# Patient Record
Sex: Male | Born: 1960 | Race: Black or African American | Hispanic: No | Marital: Single | Smoking: Never smoker
Health system: Southern US, Community
[De-identification: ages and names within clinical notes are randomized; demographics above are authoritative.]

## PROBLEM LIST (undated history)

## (undated) DIAGNOSIS — R011 Cardiac murmur, unspecified: Secondary | ICD-10-CM

---

## 2013-12-04 ENCOUNTER — Emergency Department: Payer: Self-pay | Admitting: Emergency Medicine

## 2013-12-04 LAB — BASIC METABOLIC PANEL
ANION GAP: 7 (ref 7–16)
BUN: 17 mg/dL (ref 7–18)
CALCIUM: 9.3 mg/dL (ref 8.5–10.1)
CREATININE: 0.72 mg/dL (ref 0.60–1.30)
Chloride: 106 mmol/L (ref 98–107)
Co2: 25 mmol/L (ref 21–32)
EGFR (African American): >60
EGFR (Non-African Amer.): >60
GLUCOSE: 118 mg/dL — AB (ref 65–99)
Osmolality: 278 (ref 275–301)
POTASSIUM: 3.8 mmol/L (ref 3.5–5.1)
SODIUM: 138 mmol/L (ref 136–145)

## 2013-12-04 LAB — TROPONIN I: Troponin-I: <0.02 ng/mL

## 2013-12-04 LAB — CBC
HCT: 41.5 % (ref 40.0–52.0)
HGB: 13.7 g/dL (ref 13.0–18.0)
MCH: 31.2 pg (ref 26.0–34.0)
MCHC: 33.1 g/dL (ref 32.0–36.0)
MCV: 94 fL (ref 80–100)
Platelet: 219 10*3/uL (ref 150–440)
RBC: 4.4 10*6/uL (ref 4.40–5.90)
RDW: 13.2 % (ref 11.5–14.5)
WBC: 6.8 10*3/uL (ref 3.8–10.6)

## 2013-12-05 ENCOUNTER — Emergency Department: Payer: Self-pay | Admitting: Emergency Medicine

## 2013-12-05 LAB — CBC
HCT: 43.1 %
HGB: 14.2 g/dL
MCH: 31 pg
MCHC: 33 g/dL
MCV: 94 fL
Platelet: 199 x10 3/mm 3
RBC: 4.58 x10 6/mm 3
RDW: 13.6 %
WBC: 5.7 x10 3/mm 3

## 2013-12-05 LAB — COMPREHENSIVE METABOLIC PANEL WITH GFR
Albumin: 4 g/dL
Alkaline Phosphatase: 76 U/L
Anion Gap: 6 — ABNORMAL LOW
BUN: 12 mg/dL
Bilirubin,Total: 0.5 mg/dL
Calcium, Total: 9.4 mg/dL
Chloride: 105 mmol/L
Co2: 29 mmol/L
Creatinine: 0.67 mg/dL
EGFR (African American): >60
EGFR (Non-African Amer.): >60
Glucose: 109 mg/dL — ABNORMAL HIGH
Osmolality: 280
Potassium: 4.1 mmol/L
SGOT(AST): 23 U/L
SGPT (ALT): 18 U/L
Sodium: 140 mmol/L
Total Protein: 7.5 g/dL

## 2013-12-05 LAB — DRUG SCREEN, URINE
Amphetamines, Ur Screen: NEGATIVE (ref ?–1000)
BARBITURATES, UR SCREEN: NEGATIVE (ref ?–200)
BENZODIAZEPINE, UR SCRN: NEGATIVE (ref ?–200)
Cannabinoid 50 Ng, Ur ~~LOC~~: NEGATIVE (ref ?–50)
Cocaine Metabolite,Ur ~~LOC~~: NEGATIVE (ref ?–300)
MDMA (ECSTASY) UR SCREEN: NEGATIVE (ref ?–500)
Methadone, Ur Screen: NEGATIVE (ref ?–300)
Opiate, Ur Screen: NEGATIVE (ref ?–300)
Phencyclidine (PCP) Ur S: NEGATIVE (ref ?–25)
Tricyclic, Ur Screen: NEGATIVE (ref ?–1000)

## 2013-12-05 LAB — SALICYLATE LEVEL

## 2013-12-05 LAB — ETHANOL
Ethanol %: <0.003 % (ref 0.000–0.080)
Ethanol: <3 mg/dL

## 2013-12-05 LAB — TROPONIN I: Troponin-I: <0.02 ng/mL

## 2013-12-05 LAB — TSH: Thyroid Stimulating Horm: 0.49 u[IU]/mL

## 2013-12-05 LAB — ACETAMINOPHEN LEVEL: Acetaminophen: <2 ug/mL

## 2013-12-22 ENCOUNTER — Emergency Department: Payer: Self-pay | Admitting: Emergency Medicine

## 2014-02-08 ENCOUNTER — Emergency Department (HOSPITAL_COMMUNITY)
Admission: EM | Admit: 2014-02-08 | Discharge: 2014-02-08 | Disposition: A | Discharge status: Home / Self Care | Payer: Self-pay | Attending: Emergency Medicine | Admitting: Emergency Medicine

## 2014-02-08 ENCOUNTER — Emergency Department (HOSPITAL_COMMUNITY): Payer: Self-pay

## 2014-02-08 ENCOUNTER — Encounter (HOSPITAL_COMMUNITY): Payer: Self-pay | Admitting: Emergency Medicine

## 2014-02-08 DIAGNOSIS — R5381 Other malaise: Secondary | ICD-10-CM | POA: Insufficient documentation

## 2014-02-08 DIAGNOSIS — R011 Cardiac murmur, unspecified: Secondary | ICD-10-CM | POA: Insufficient documentation

## 2014-02-08 DIAGNOSIS — M546 Pain in thoracic spine: Secondary | ICD-10-CM | POA: Insufficient documentation

## 2014-02-08 DIAGNOSIS — R5383 Other fatigue: Secondary | ICD-10-CM

## 2014-02-08 DIAGNOSIS — R42 Dizziness and giddiness: Secondary | ICD-10-CM | POA: Insufficient documentation

## 2014-02-08 DIAGNOSIS — R109 Unspecified abdominal pain: Secondary | ICD-10-CM | POA: Insufficient documentation

## 2014-02-08 DIAGNOSIS — R609 Edema, unspecified: Secondary | ICD-10-CM | POA: Insufficient documentation

## 2014-02-08 DIAGNOSIS — H9190 Unspecified hearing loss, unspecified ear: Secondary | ICD-10-CM | POA: Insufficient documentation

## 2014-02-08 DIAGNOSIS — R0789 Other chest pain: Secondary | ICD-10-CM | POA: Insufficient documentation

## 2014-02-08 DIAGNOSIS — R110 Nausea: Secondary | ICD-10-CM | POA: Insufficient documentation

## 2014-02-08 DIAGNOSIS — R197 Diarrhea, unspecified: Secondary | ICD-10-CM | POA: Insufficient documentation

## 2014-02-08 HISTORY — DX: Cardiac murmur, unspecified: R01.1

## 2014-02-08 LAB — BASIC METABOLIC PANEL
ANION GAP: 13 (ref 5–15)
BUN: 19 mg/dL (ref 6–23)
CALCIUM: 9.4 mg/dL (ref 8.4–10.5)
CO2: 24 mEq/L (ref 19–32)
Chloride: 102 mEq/L (ref 96–112)
Creatinine, Ser: 0.93 mg/dL (ref 0.50–1.35)
GFR calc Af Amer: >90 mL/min (ref >90)
GFR calc non Af Amer: >90 mL/min (ref >90)
GLUCOSE: 93 mg/dL (ref 70–99)
Potassium: 4.2 mEq/L (ref 3.7–5.3)
SODIUM: 139 meq/L (ref 137–147)

## 2014-02-08 LAB — I-STAT TROPONIN, ED
TROPONIN I, POC: 0 ng/mL (ref 0.00–0.08)
Troponin i, poc: 0 ng/mL (ref 0.00–0.08)
Troponin i, poc: 0 ng/mL (ref 0.00–0.08)

## 2014-02-08 LAB — CBC
HCT: 41.9 % (ref 39.0–52.0)
Hemoglobin: 14 g/dL (ref 13.0–17.0)
MCH: 31 pg (ref 26.0–34.0)
MCHC: 33.4 g/dL (ref 30.0–36.0)
MCV: 92.7 fL (ref 78.0–100.0)
PLATELETS: 237 10*3/uL (ref 150–400)
RBC: 4.52 MIL/uL (ref 4.22–5.81)
RDW: 13.5 % (ref 11.5–15.5)
WBC: 5.7 10*3/uL (ref 4.0–10.5)

## 2014-02-08 LAB — URINALYSIS, ROUTINE W REFLEX MICROSCOPIC
BILIRUBIN URINE: NEGATIVE
Glucose, UA: NEGATIVE mg/dL
Hgb urine dipstick: NEGATIVE
Ketones, ur: NEGATIVE mg/dL
Leukocytes, UA: NEGATIVE
NITRITE: NEGATIVE
Protein, ur: NEGATIVE mg/dL
SPECIFIC GRAVITY, URINE: 1.01 (ref 1.005–1.030)
Urobilinogen, UA: 0.2 mg/dL (ref 0.0–1.0)
pH: 5 (ref 5.0–8.0)

## 2014-02-08 LAB — RAPID URINE DRUG SCREEN, HOSP PERFORMED
Amphetamines: NOT DETECTED
BARBITURATES: NOT DETECTED
Benzodiazepines: NOT DETECTED
COCAINE: NOT DETECTED
Opiates: POSITIVE — AB
TETRAHYDROCANNABINOL: NOT DETECTED

## 2014-02-08 LAB — D-DIMER, QUANTITATIVE: D-Dimer, Quant: <0.27 ug/mL-FEU (ref 0.00–0.48)

## 2014-02-08 MED ORDER — MORPHINE SULFATE 4 MG/ML IJ SOLN
4.0000 mg | Freq: Once | INTRAMUSCULAR | Status: AC
  Administered 2014-02-08: 4 mg via INTRAVENOUS
  Filled 2014-02-08: qty 1

## 2014-02-08 MED ORDER — IOHEXOL 350 MG/ML SOLN
100.0000 mL | Freq: Once | INTRAVENOUS | Status: AC | PRN
  Administered 2014-02-08: 100 mL via INTRAVENOUS

## 2014-02-08 MED ORDER — ONDANSETRON HCL 4 MG/2ML IJ SOLN
4.0000 mg | Freq: Once | INTRAMUSCULAR | Status: AC
  Administered 2014-02-08: 4 mg via INTRAVENOUS
  Filled 2014-02-08: qty 2

## 2014-02-08 MED ORDER — TRAMADOL HCL 50 MG PO TABS: 50.0000 mg | ORAL_TABLET | Freq: Four times a day (QID) | ORAL | Status: DC | PRN

## 2014-02-08 MED ORDER — SODIUM CHLORIDE 0.9 % IV BOLUS (SEPSIS)
500.0000 mL | Freq: Once | INTRAVENOUS | Status: AC
  Administered 2014-02-08: 500 mL via INTRAVENOUS

## 2014-02-08 MED ORDER — TRAMADOL HCL 50 MG PO TABS: 50.0000 mg | ORAL_TABLET | Freq: Four times a day (QID) | ORAL | Status: AC | PRN

## 2014-02-08 NOTE — ED Notes (Signed)
Pt presents with onset of mid-sternal chest pain and shortness of breath just PTA while walking.  Pt reports pain radiates to mid-scapular area, into both clavicles, into abdomen and both legs.  Pt reports nausea.

## 2014-02-08 NOTE — ED Provider Notes (Signed)
3:34 PM BP 109/75  Pulse 54  Temp(Src) 97.4 F (36.3 C) (Oral)  Resp 16  SpO2 100% Assumed care of the patient form PA Laveda Normanran.  Patient is a poor historian. C/O acute onset cp radiating to BL shoulders, abdomen.  All labs/ ekg negative to this point including. HEART score is 1. Negative troponin. D-Dimer negative.  5:34 PM BP 109/75  Pulse 54  Temp(Src) 97.4 F (36.3 C) (Oral)  Resp 16  SpO2 100% Patient with negative CTA chest/abd/pelvis Patient will need delta troponin Which I have ordered.   6:49 PM Patient is an extremely poor historian. He continues to complain of severe chest pain. I question if the patient has psychosis or moderate cognitive delay. He does state that he was hospitalized in 2011 for the same problem [facility] [place] We have called to obtain records. 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 He states his pain is severe every time he talks or move.   8:07 PM I was able to [facility] [place]. He had 3 visits there all for minor pain complaints.1 for dental pain and  For chronic shoulder pain and chest pain. No ICU admission.  9:15 PM 3 negative troponins here in the ED.  Doubt Highly  ACS as the cause of the patient's pain. I have discussed the case with Dr. Devoria AlbeIva Knapp who agrees with the plan of care.  Will discharge with tramadol and resource guide. [place].     Arthor CaptainAbigail Kindall Swaby, PA-C 02/09/14 [phone]

## 2014-02-08 NOTE — ED Provider Notes (Signed)
CSN: [id number]     Arrival date & time 02/08/14  1204 History   First MD Initiated Contact with Patient 02/08/14 1354     No chief complaint on file.    (Consider location/radiation/quality/duration/timing/severity/associated sxs/prior Treatment) HPI  53 year old male who has a past history of heart murmur presenting to the ED for evaluation of a substernal chest pain and shortness of breath. History is difficult to obtain as pt does not give good history.  Acute onset of CP 45 min ago, progressive worse, sharp, non radiating.  Endorse SOB, lightheadedness, dizzy, nausea, abd pain, fatigue, diarrhea, and leg edema along with hearing changes. Sts pain now radiates to mid-scapular, into both clavicles down in abdomen then now down to both legs.  Denies any urinary or bowel changes. No prior history of heart disease. Patient is nonsmoker. No prior history of PE or DVT, no recent surgery, prolonged bed rest leg swelling, history of cancer, or hemoptysis. sts the last time he has pain like this was when he was in the ICU in 2013 but unable to specify for what reason and what kind of treatment he received. No specific treatment tried. Pt sts he is here visiting a friend.  Denies alcohol or rec drug use, not a smoker, denies hx of AAA.  No SI/HI/hallucination.    Past Medical History  Diagnosis Date  . Heart murmur    History reviewed. No pertinent past surgical history. History reviewed. No pertinent family history. History  Substance Use Topics  . Smoking status: Never Smoker   . Smokeless tobacco: Not on file  . Alcohol Use: No    Review of Systems  All other systems reviewed and are negative.     Allergies  Review of patient's allergies indicates no known allergies.  Home Medications   Prior to Admission medications   Not on File   BP 121/72  Pulse 75  Temp(Src) 98.4 F (36.9 C) (Oral)  Resp 16  SpO2 97% Physical Exam  Constitutional: He is oriented to person, place, and  time. He appears well-developed and well-nourished. No distress.  HENT:  Head: Atraumatic.  Eyes: Conjunctivae are normal.  Neck: Normal range of motion. Neck supple. No JVD present. No tracheal deviation present.  Cardiovascular: Normal rate, regular rhythm and intact distal pulses.  Exam reveals no gallop and no friction rub.   No murmur heard. Intact pulses to bilateral upper extremities and lower extremities  Pulmonary/Chest: Effort normal and breath sounds normal. No respiratory distress. He has no wheezes. He has no rales. He exhibits no tenderness.  Abdominal: Soft. He exhibits no distension. There is no tenderness. There is no rebound and no guarding.  Abdomen is soft, nontender, no palpable pulsatile mass, and no bruit noted  Genitourinary:  No CVA tenderness  Musculoskeletal: He exhibits no edema and no tenderness.  No midline spine tenderness crepitus step-off  Lymphadenopathy:    He has no cervical adenopathy.  Neurological: He is alert and oriented to person, place, and time.  Skin: No rash noted.  Psychiatric: He has a normal mood and affect.    ED Course  Procedures (including critical care time)  3:10 PM Patient who is an extremely poor historian presents complaining of midsternal chest pain with shortness of breath that is acute in onset radiation is back at his abdomen and down to his legs. Pain is gone reproducible on exam. Patient states his pain is "internal". He is hemodynamically stable. Given his poor history and his primary complaint,  cannot rule out PE or dissection. i have discussed with my attending who felt pt will benefit from advance imaging to r/o acute emergent condition.  Will obtain chest/abd/pelvis CT angio to r/o dissection.    3:35 PM Care discussed with oncoming provider who will continue to monitor pt and continue with plan of care.  Pt will need delta trop and reassessment of pain.  Will also need to ambulate with O2 monitor    Labs Review Labs  Reviewed  CBC  BASIC METABOLIC PANEL  Rosezena SensorI-STAT TROPOININ, ED    Imaging Review Dg Chest Port 1 View  02/08/2014   CLINICAL DATA:  Chest pain.  EXAM: PORTABLE CHEST - 1 VIEW  COMPARISON:  12/04/2013  FINDINGS: 1348 hrs. Lung volumes are low. No edema or focal airspace consolidation. No pleural effusion. Cardiopericardial silhouette is at upper limits of normal for size. Imaged bony structures of the thorax are intact. Telemetry leads overlie the chest.  IMPRESSION: Low volume film without acute findings.   Electronically Signed   By: Kennith CenterEric  Mansell M.D.   On: 02/08/2014 13:54     EKG Interpretation   Date/Time:  Thursday February 08 2014 12:18:22 EDT Ventricular Rate:  75 PR Interval:  174 QRS Duration: 76 QT Interval:  390 QTC Calculation: 435 R Axis:   57 Text Interpretation:  Normal sinus rhythm Normal ECG No old tracing to  compare Confirmed by [facility]  MD, Nicholos JohnsKATHLEEN [phone]([id number]) on 02/08/2014 1:58:59 PM      MDM   Final diagnoses:  None    BP 109/75  Pulse 54  Temp(Src) 97.4 F (36.3 C) (Oral)  Resp 16  SpO2 100%     Fayrene HelperBowie Charmel Pronovost, PA-C 02/08/14 1537

## 2014-02-08 NOTE — ED Notes (Signed)
Patient transported to CT 

## 2014-02-08 NOTE — Discharge Instructions (Signed)
Your caregiver has diagnosed you as having chest pain that is not specific for one problem, but does not require admission.  You are at low risk for an acute heart condition or other serious illness. Chest pain comes from many different causes.  SEEK IMMEDIATE MEDICAL ATTENTION IF: You have severe chest pain, especially if the pain is crushing or pressure-like and spreads to the arms, back, neck, or jaw, or if you have sweating, nausea (feeling sick to your stomach), or shortness of breath. THIS IS AN EMERGENCY. Don't wait to see if the pain will go away. Get medical help at once. Call 911 or 0 (operator). DO NOT drive yourself to the hospital.  Your chest pain gets worse and does not go away with rest.  You have an attack of chest pain lasting longer than usual, despite rest and treatment with the medications your caregiver has prescribed.  You wake from sleep with chest pain or shortness of breath.  You feel dizzy or faint.  You have chest pain not typical of your usual pain for which you originally saw your caregiver.  Back Pain, Adult Low back pain is very common. About 1 in 5 people have back pain.The cause of low back pain is rarely dangerous. The pain often gets better over time.About half of people with a sudden onset of back pain feel better in just 2 weeks. About 8 in 10 people feel better by 6 weeks.  CAUSES Some common causes of back pain include:  Strain of the muscles or ligaments supporting the spine.  Wear and tear (degeneration) of the spinal discs.  Arthritis.  Direct injury to the back. DIAGNOSIS Most of the time, the direct cause of low back pain is not known.However, back pain can be treated effectively even when the exact cause of the pain is unknown.Answering your caregiver's questions about your overall health and symptoms is one of the most accurate ways to make sure the cause of your pain is not dangerous. If your caregiver needs more information, he or she may  order lab work or imaging tests (X-rays or MRIs).However, even if imaging tests show changes in your back, this usually does not require surgery. HOME CARE INSTRUCTIONS For many people, back pain returns.Since low back pain is rarely dangerous, it is often a condition that people can learn to [facility] their own.   Remain active. It is stressful on the back to sit or stand in one place. Do not sit, drive, or stand in one place for more than 30 minutes at a time. Take short walks on level surfaces as soon as pain allows.Try to increase the length of time you walk each day.  Do not stay in bed.Resting more than 1 or 2 days can delay your recovery.  Do not avoid exercise or work.Your body is made to move.It is not dangerous to be active, even though your back may hurt.Your back will likely heal faster if you return to being active before your pain is gone.  Pay attention to your body when you bend and lift. Many people have less discomfortwhen lifting if they bend their knees, keep the load close to their bodies,and avoid twisting. Often, the most comfortable positions are those that put less stress on your recovering back.  Find a comfortable position to sleep. Use a firm mattress and lie on your side with your knees slightly bent. If you lie on your back, put a pillow under your knees.  Only take over-the-counter or  prescription medicines as directed by your caregiver. Over-the-counter medicines to reduce pain and inflammation are often the most helpful.Your caregiver may prescribe muscle relaxant drugs.These medicines help dull your pain so you can more quickly return to your normal activities and healthy exercise.  Put ice on the injured area.  Put ice in a plastic bag.  Place a towel between your skin and the bag.  Leave the ice on for 15-20 minutes, 03-04 times a day for the first 2 to 3 days. After that, ice and heat may be alternated to reduce pain and spasms.  Ask your  caregiver about trying back exercises and gentle massage. This may be of some benefit.  Avoid feeling anxious or stressed.Stress increases muscle tension and can worsen back pain.It is important to recognize when you are anxious or stressed and learn ways to manage it.Exercise is a great option. SEEK MEDICAL CARE IF:  You have pain that is not relieved with rest or medicine.  You have pain that does not improve in 1 week.  You have new symptoms.  You are generally not feeling well. SEEK IMMEDIATE MEDICAL CARE IF:   You have pain that radiates from your back into your legs.  You develop new bowel or bladder control problems.  You have unusual weakness or numbness in your arms or legs.  You develop nausea or vomiting.  You develop abdominal pain.  You feel faint. Document Released: 07/20/2005 Document Revised: 01/19/2012 Document Reviewed: 12/08/2010 [facility] Patient Information 2015 [place], [place]. This information is not intended to replace advice given to you by your health care provider. Make sure you discuss any questions you have with your health care provider.  Emergency Department Resource Guide 1) Find a Doctor and Pay Out of Pocket Although you won't have to find out who is covered by your insurance plan, it is a good idea to ask around and get recommendations. You will then need to call the office and see if the doctor you have chosen will accept you as a new patient and what types of options they offer for patients who are self-pay. Some doctors offer discounts or will set up payment plans for their patients who do not have insurance, but you will need to ask so you aren't surprised when you get to your appointment.  2) Contact Your Local Health Department Not all health departments have doctors that can see patients for sick visits, but many do, so it is worth a call to see if yours does. If you don't know where your local health department is, you can check in your  phone book. The CDC also has a tool to help you locate your state's health department, and many state websites also have listings of all of their local health departments.  3) Find a Walk-in Clinic If your illness is not likely to be very severe or complicated, you may want to try a walk in clinic. These are popping up all over the country in pharmacies, drugstores, and shopping centers. They're usually staffed by nurse practitioners or physician assistants that have been trained to treat common illnesses and complaints. They're usually fairly quick and inexpensive. However, if you have serious medical issues or chronic medical problems, these are probably not your best option.  No Primary Care Doctor: - Call Health Connect at  [phone] - they can help you locate a primary care doctor that  accepts your insurance, provides certain services, etc. - Physician Referral Service- [phone]  Chronic Pain Problems:  Organization         Address  Phone   Notes  [facility]  [phone] Patients need to be referred by their primary care doctor.   Medication Assistance: Organization         Address  Phone   Notes  [facility] Medication [facility] [address] [place], [place] [address] [phone] --Must be a resident of [facility] -- Must have NO insurance coverage whatsoever (no Medicaid/ Medicare, etc.) -- The pt. MUST have a primary care doctor that directs their care regularly and follows them in the community   [facility]ist  [phone]   [employer]  [phone]    Agencies that provide inexpensive medical care: Organization         Address  Phone   Notes  [facility]  [phone]   [facility]    [phone]   [facility] [address] [place], [place] [address] [phone]   [facility] [address] [place] [place] [phone]   Planned  Parenthood    [phone]   [facility]    [phone]   [facility]  [address], [place] Phone:  [phone], Fax:  [phone] Hours of Operation:  9 am - 6 pm, M-F.  Also accepts Medicaid/Medicare and self-pay.  [facility]  [address], [place] Phone: [phone], Fax: [phone]. Hours of Operation:  8:30 am - 5:30 pm, M-F.  Also accepts Medicaid and self-pay.  [facility] [place] [address], [place] Phone: [phone]   [facility] [address] Natasha Bence [place], [place] [phone], Ext. [phone] Mondays & Thursdays: 7-9 AM.  First 15 patients are seen on a first come, first serve basis.    Medicaid-accepting [facility] Providers:  Organization         Address  Phone   Notes  [facility] [address], [place] [phone] Also accepts self-pay patients.  [facility] [address] Laurell Josephs [place], [place]  [phone]   [facility] [address], [place] [phone]   [facility] [address] [place] [phone]   Renaye Rakers [address], [place]   [phone] Only accepts [place] patients after they have their name applied to their card.   Self-Pay (no insurance) in [facility]:  Organization         Address  Phone   Notes  Sickle Cell Patients, [facility] [address] [place], [place] [phone]   [facility] [address] [place], [place] [phone]   [facility] [place]  [address], [place] [phone]   [facility]/Dr. Osei-Bonsu  [address], [place] or [address], [place] [phone] Phone number for both [place] and [place] locations is the same.    [facility] [address], [place] [phone]   [facility] [address], [place] or [address] [phone]   [facility] [address] [place] [phone], phone; [phone], fax Sees patients 1st  and 3rd Saturday of every month.  Must not qualify for public or private insurance (i.e. Medicaid, Medicare, [place] Health Choice, Veterans' Benefits)  Household income should be no more than 200% of the poverty level The clinic cannot treat you if you are pregnant or think you are pregnant  Sexually transmitted diseases are not treated at the clinic.    Dental Care: Organization         Address  Phone  Notes  [facility] Department of [facility] [address] [place], [place] [phone] Accepts children up to age 81 who are enrolled in [place] or [place] Health Choice; pregnant women with a Medicaid card; and children who have applied for Medicaid or [place] Health Choice, but were declined, whose parents can pay a reduced fee at time of service.  [facility] Department of [facility]  [address], [place] [phone] Accepts children up to age 12 who are enrolled in [place] or [place] Health Choice; pregnant women with a Medicaid card; and children who have applied for Medicaid or [place] Health Choice, but were declined, whose parents can pay a reduced fee at time of service.  [facility]  [address] [place], [place] [phone] Patients are seen by appointment only. Walk-ins are not accepted. [facility] will see patients 61 years of age and older. Monday - Tuesday (8am-5pm) Most Wednesdays (8:30-5pm) $30 per visit, cash only  [facility] Adult Dental Access PRO[facility]  [address], [facility] [phone] Patients are seen by appointment only. Walk-ins are not accepted. [facility] will see patients 9  years of age and older. One Wednesday Evening (Monthly: Volunteer Based).  $30 per visit, cash only  [employer]  [phone] for adults; Children under age 75, call Graduate Pediatric Dentistry at [phone]. Children aged 30-14, please call [phone] to request a pediatric application.  Dental services are provided in all areas of dental care including fillings, crowns and bridges, complete and partial dentures, implants, gum treatment, root canals, and extractions. Preventive care is also provided. Treatment is provided to both adults and children. Patients are selected via a lottery and there is often a waiting list.   [facility] [address] [place]  [phone] [email or web address]   [facility] [address] [place], [place] [phone], Ext. [phone] Second and Fourth Thursday of each month, opens at 6:30 AM; Clinic ends at 9 AM.  Patients are seen on a first-come first-served basis, and a limited number are seen during each clinic.   [facility]  [address] Ether Griffins [place], [place] [phone]   Eligibility Requirements You must have lived in [place], [place], or [place] counties for at least the last three months.   You cannot be eligible for state or federal sponsored [place], including [employer], [place], or [employer].   You generally cannot be eligible for healthcare insurance through your employer.    How to apply: Eligibility screenings are held every Tuesday and Wednesday afternoon from 1:00 pm until 4:00 pm. You do not need an appointment for the interview!  [facility] [address] [place], [place] [phone]   [facility] Health Department  [phone]   [facility] Health Department  [phone]   [facility] Health [facility]  [phone]    Behavioral Health Resources in the Community: Intensive Outpatient  Programs Organization  Address  Phone  Notes  [facility] [address], [place], [place] [phone]   [facility] [address], [place], [place] [phone]   [facility] [address], [place], [place]  [phone]   [facility] [address],  [place], [place] [phone] or [phone]   Substance Abuse Resources Organization         Address  Phone  Notes  [facility]  [phone]   [facility]  [phone]   [facility]  [phone]   [facility]  [phone]   Residential & Outpatient Substance Abuse Program  [phone]   Psychological Services Organization         Address  Phone  Notes  [facility]  [phone]- [phone]   [facility]  [phone]- [phone]   [facility] [address], [place] [phone] or [phone]    Mobile Crisis Teams Organization         Address  Phone  Notes  [facility], Mobile Crisis Care Unit  [phone]   [facility]  [address] [place], [place] [phone]   Doristine LocksSharon DeEsch [address] [place] [phone]    Self-Help/Support Groups Organization         Address  Phone             Notes  [facility] [place] - variety of support groups  [phone]- [phone] Call for more information  Narcotics Anonymous (NA), [facility] [address], [place]  2 meetings at this location   Statisticianesidential Treatment Programs Organization         Address  Phone  Notes  [facility] [address],    [place]  [phone]   [facility]  [address], [place] [phone], [place], [place] [phone]   [facility] [address], [place] [phone] Admissions: 8am-3pm M-F  [facility] [address]    [place], [place]  [phone]   [facility] [address], [place], [place] [phone]   [facility] [address]  [place], [place] [phone]   [facility] - Intensive Outpatient [address], [place], [place] [phone]   [facility] ([facility] [address]  [place], [place] [phone] or [phone]   [facility] ([facility]) [address] [place], [place] [phone] Accepts Medicaid  [facility] [address]  [place] [phone] Substance Abuse/Addiction Treatment   [facility] Resources Organization         Address  Phone  Notes  [facility]  [phone]   Angie FavaJulie Brannon, PhD [address] [place], [place]   [phone] or [phone]   [facility]   [address] [place], [place] [phone]   [facility] [address], [place], [place] [phone] Insurance/Medicaid/sponsorship through [facility]  [facility] [address]                                    [place], [place] [phone] Therapy/tele-psych/case  [facility] [address]   [place], [place] [phone]) [phone]    Dr. Lolly MustacheArfeen  [phone]   [facility]  Felt Dept. 1) [address] [place] 2) [place] 3)  [address], [place] [phone]  [phone]   [place] [phone] or [phone] (After Hours)

## 2014-02-09 NOTE — ED Provider Notes (Signed)
Medical screening examination/treatment/procedure(s) were performed by non-physician practitioner and as supervising physician I was immediately available for consultation/collaboration.   EKG Interpretation   Date/Time:  Thursday February 08 2014 12:18:22 EDT Ventricular Rate:  75 PR Interval:  174 QRS Duration: 76 QT Interval:  390 QTC Calculation: 435 R Axis:   57 Text Interpretation:  Normal sinus rhythm Normal ECG No old tracing to  compare Confirmed by [facility]  MD, Nicholos JohnsKATHLEEN [phone]([id number]) on 02/08/2014 1:58:59 PM      Devoria AlbeIva Caleen Taaffe, MD, Franz DellFACEP   Sharonica Kraszewski L Briellah Baik, MD 02/09/14 1413

## 2014-02-10 NOTE — ED Provider Notes (Signed)
Medical screening examination/treatment/procedure(s) were performed by non-physician practitioner and as supervising physician I was immediately available for consultation/collaboration.   EKG Interpretation   Date/Time:  Thursday February 08 2014 12:18:22 EDT Ventricular Rate:  75 PR Interval:  174 QRS Duration: 76 QT Interval:  390 QTC Calculation: 435 R Axis:   57 Text Interpretation:  Normal sinus rhythm Normal ECG No old tracing to  compare Confirmed by [facility]  MD, Nicholos JohnsKATHLEEN [phone]([id number]) on 02/08/2014 1:58:59 PM        Laray AngerKathleen M Venera Privott, DO 02/10/14 1358

## 2014-11-24 NOTE — Consult Note (Signed)
PATIENT NAME:  Larry Brennan, Larry Brennan MR#:  [id number] DATE OF BIRTH:  [birth year]  DATE OF CONSULTATION:  12/06/2013  REFERRING PHYSICIAN:   CONSULTING PHYSICIAN:  Audery Amel, MD  IDENTIFYING INFORMATION AND REASON FOR CONSULT:  A 54 year old gentleman, who came to the Emergency Room initially complaining of physical problems, but then was noted to be mentally ill. Consultation for appropriate disposition.   HISTORY OF PRESENT ILLNESS:  Information obtained from the chart and the patient. The patient apparently came to the Emergency Room complaining of chest pain. No acute serious pathology was identified regarding chest pain, but it became obvious to the Emergency Room doctor that the patient was disorganized and psychotic in his thinking. Commitment paperwork was filed, and he was referred to psychiatry. The patient is not a good historian. He denies to me that he has a mental illness and denies that he has ever seen anyone for any kind of mental health treatment in the past and denies that he has ever been in a hospital. He rambles, but also tends to repeat himself saying that he just needs to be given his discharge paperwork, so that he can be on his way. He has told variations of his plan to multiple people here in the Emergency Room. He told one person he planned to go back to [place], another that he planned to go to [place] and various other places. As near as we can tell, this gentleman probably has schizophrenia and probably tends to ramble on [employer] buses where he can. It is very difficult to engage him in conversation, so I cannot really confirm the details of that. He is currently not reporting any suicidal or homicidal ideation or any acute dangerousness.   PAST PSYCHIATRIC HISTORY:  He really will not give me any information. I would be astonished if this gentleman had never had psychiatric treatment or evaluation in the past given how obviously psychotic he is, but that is what he is  saying. Denies any history of violence or suicide attempts.   SUBSTANCE ABUSE HISTORY:  He denied to me that he uses any alcohol or drugs at all. Right now, we do not seem to have anything to contradict that.   PAST MEDICAL HISTORY:  He tells me that he has a breathing chest disorder. As near as I can tell, he seems to be saying that he has asthma. I cannot get any other clear information about any medical problems from him.   SOCIAL HISTORY:  Again, very little information available. He seems to probably be homeless. It appears that he went to the homeless shelter here in town and was turned away because he is not a resident of our community, but is from out of state. This left him with no place at all to stay. He seems unconcerned about this and tells me that he plans to go to [place] where [place] live [place].   CURRENT MEDICATIONS:  None.   ALLERGIES:  No known drug allergies.   REVIEW OF SYSTEMS:  He denies anything. Full review of systems negative. Denies hallucinations. Denies suicidal ideation. Denies depression. Denies any pain or discomfort.   MENTAL STATUS EXAMINATION:  Disheveled gentleman, looks his stated age. Otherwise appears in pretty good physical health. His cooperation was entirely on his own terms. Eye contact was good. Psychomotor activity a little bit stereotyped. Speech was flat, but loud. Pressured, but sort of telegraphic. Thought content repeats itself over and over. He does not really  engage in rational discussion. Unclear if he is responding to internal stimuli. Denies suicidal or homicidal ideation. Judgment and insight appear to be impaired. Hard to gauge intelligence. He will not cooperate with any further testing.   LABORATORY RESULTS:  TSH normal. CBC normal. No alcohol. Chemistry panel unremarkable. Drug screen negative. Remarkably normal set of labs overall.   ASSESSMENT:  A 54 year old gentleman, who certainly appears to have all of the traits of a  person with schizophrenia, but who is denying it all and not participating in any kind of conversation or treatment plan. He repeats himself over and over that he just wants to be released. We do not have any evidence that he has a rational or reasonable treatment plan. It is not clear that he is actually acutely dangerous to himself, although it is right now hard to imagine how he is going to take care of himself, and he cannot give any good account of it. Given that it is nighttime now and nothing is open, I think he will continue to stay in the Emergency Room at least overnight. We will re-evaluate tomorrow. Given the lack of obvious acute dangerousness, there is a good chance we will simply be discharging him tomorrow.   DIAGNOSIS, PRINCIPAL AND PRIMARY:  AXIS I:  Schizophrenia, undifferentiated.   SECONDARY DIAGNOSES:  AXIS I:  No further.  AXIS II:  No diagnosis.  AXIS III:  No diagnosis.  AXIS IV:  Moderate from chronic homelessness it appears.  AXIS V:  Functioning at time of evaluation 55.     ____________________________ Audery AmelJohn T. Clapacs, MD jtc:ms D: 12/06/2013 22:13:53 ET T: 12/07/2013 00:38:09 ET JOB#: [id number]  cc: Audery AmelJohn T. Clapacs, MD, <Dictator> Audery AmelJOHN T CLAPACS MD ELECTRONICALLY SIGNED 12/11/2013 0:42

## 2014-11-24 NOTE — Consult Note (Signed)
PATIENT NAME:  Larry Brennan, Larry Brennan MR#:  [id number] DATE OF BIRTH:  1961/07/29  DATE OF CONSULTATION:  12/07/2013  REFERRING PHYSICIAN:   CONSULTING PHYSICIAN:  Audery AmelJohn T. Clapacs, MD  HISTORY OF PRESENT ILLNESS: This is a 54 year old gentleman who came to the Emergency Room yesterday for a strictly medical complaint, but was observed to be disorganized and psychotic in his thinking. He was placed under involuntary commitment by the Emergency Room physician. On evaluation, the patient presents to me as being disorganized and psychotic in his thinking. His story changes frequently. He does not hold a line of conversation very stably. On the other hand, he completely denies suicidal or homicidal ideation and has not shown any dangerous behavior. He clearly understands the use of money and states that he has some money in his wallet that he can use to get himself a bus ticket to go on his way. 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 He is requesting discharge. He has been offered an antipsychotic medication and treatment and has declined it.   REVIEW OF SYSTEMS: The patient is not very cooperative with history, but denies hallucinations, denies suicidal or homicidal ideation, denies now having any acute physical symptoms. The rest of the review of systems is negative.   MENTAL STATUS EXAMINATION: A slightly disheveled gentleman with a rather odd affect. Eye contact good. Psychomotor activity normal. Speech decreased in total amount and blunted but easy to understand. Affect blunted and flat. Mood stated as fine. Thoughts appeared disorganized. His sentences often do not hold together and he repeats himself sometimes changing his story or contradicting himself. He denies hallucinations. He denies suicidal or homicidal ideation. His judgment and insight appear to be impaired, especially regarding his illness, but there is evidence that he has  been able to take care of himself recently. Not cooperative with cognitive testing, but seems alert and oriented to his situation, the time and the general place.   CURRENT MEDICATIONS: None.   ALLERGIES: No known drug allergies.   ASSESSMENT: This is a 54 year old man who on superficial mental status exam certainly appears to be disorganized in his thinking in a manner that would be very consistent with schizophrenia, although without further history the diagnosis is not 100%. At this point, there is no sign that he is acutely dangerous to himself or others. He has not shown dangerous behavior or made any threats. He is declining our offers of antipsychotic treatment. He is declining further conversation about it and wants to be discharged. At this point, I think that he does not meet commitment criteria. I will take him off involuntary commitment. The case has been discussed with the Emergency Room physician. The patient will be discharged. He declines follow-up.   DIAGNOSIS, PRINCIPAL AND PRIMARY:  AXIS I: Psychosis, not otherwise specified.   SECONDARY DIAGNOSES: AXIS I: No further.  AXIS II: No diagnosis.  AXIS III: No diagnosis.  AXIS IV: Moderate from what seems like a transient lifestyle.  AXIS V: Functioning at time of discharge 55.  ____________________________ Audery AmelJohn T. Clapacs, MD jtc:sb D: 12/07/2013 12:35:56 ET T: 12/07/2013 12:47:58 ET JOB#: [id number]  cc: Audery AmelJohn T. Clapacs, MD, <Dictator> Audery AmelJOHN T CLAPACS MD ELECTRONICALLY SIGNED 12/11/2013 0:42

## 2014-11-24 NOTE — Consult Note (Signed)
Brief Consult Note: Diagnosis: schizophrenia.   Patient was seen by consultant.   Consult note dictated.   Recommend further assessment or treatment.   Comments: Psychiatry: PAtient seen and chart reviewwed. Patient has every appearance of being schizophrenic and can't hold rational conversation. No clear disposition. Re-evaluate tomorrow.  Electronic Signatures: Audery Amellapacs, Yazlin Ekblad T (MD)  (Signed 06-May-15 21:06)  Authored: Brief Consult Note   Last Updated: 06-May-15 21:06 by Audery Amellapacs, Rayelle Armor T (MD)

## 2015-10-11 IMAGING — CR DG CHEST 1V PORT
1 series · 1 of 1 positions shown · non-contrast
Comparison: 12/04/2013

CLINICAL DATA: Chest pain.

EXAM:
PORTABLE CHEST - 1 VIEW

[AP]
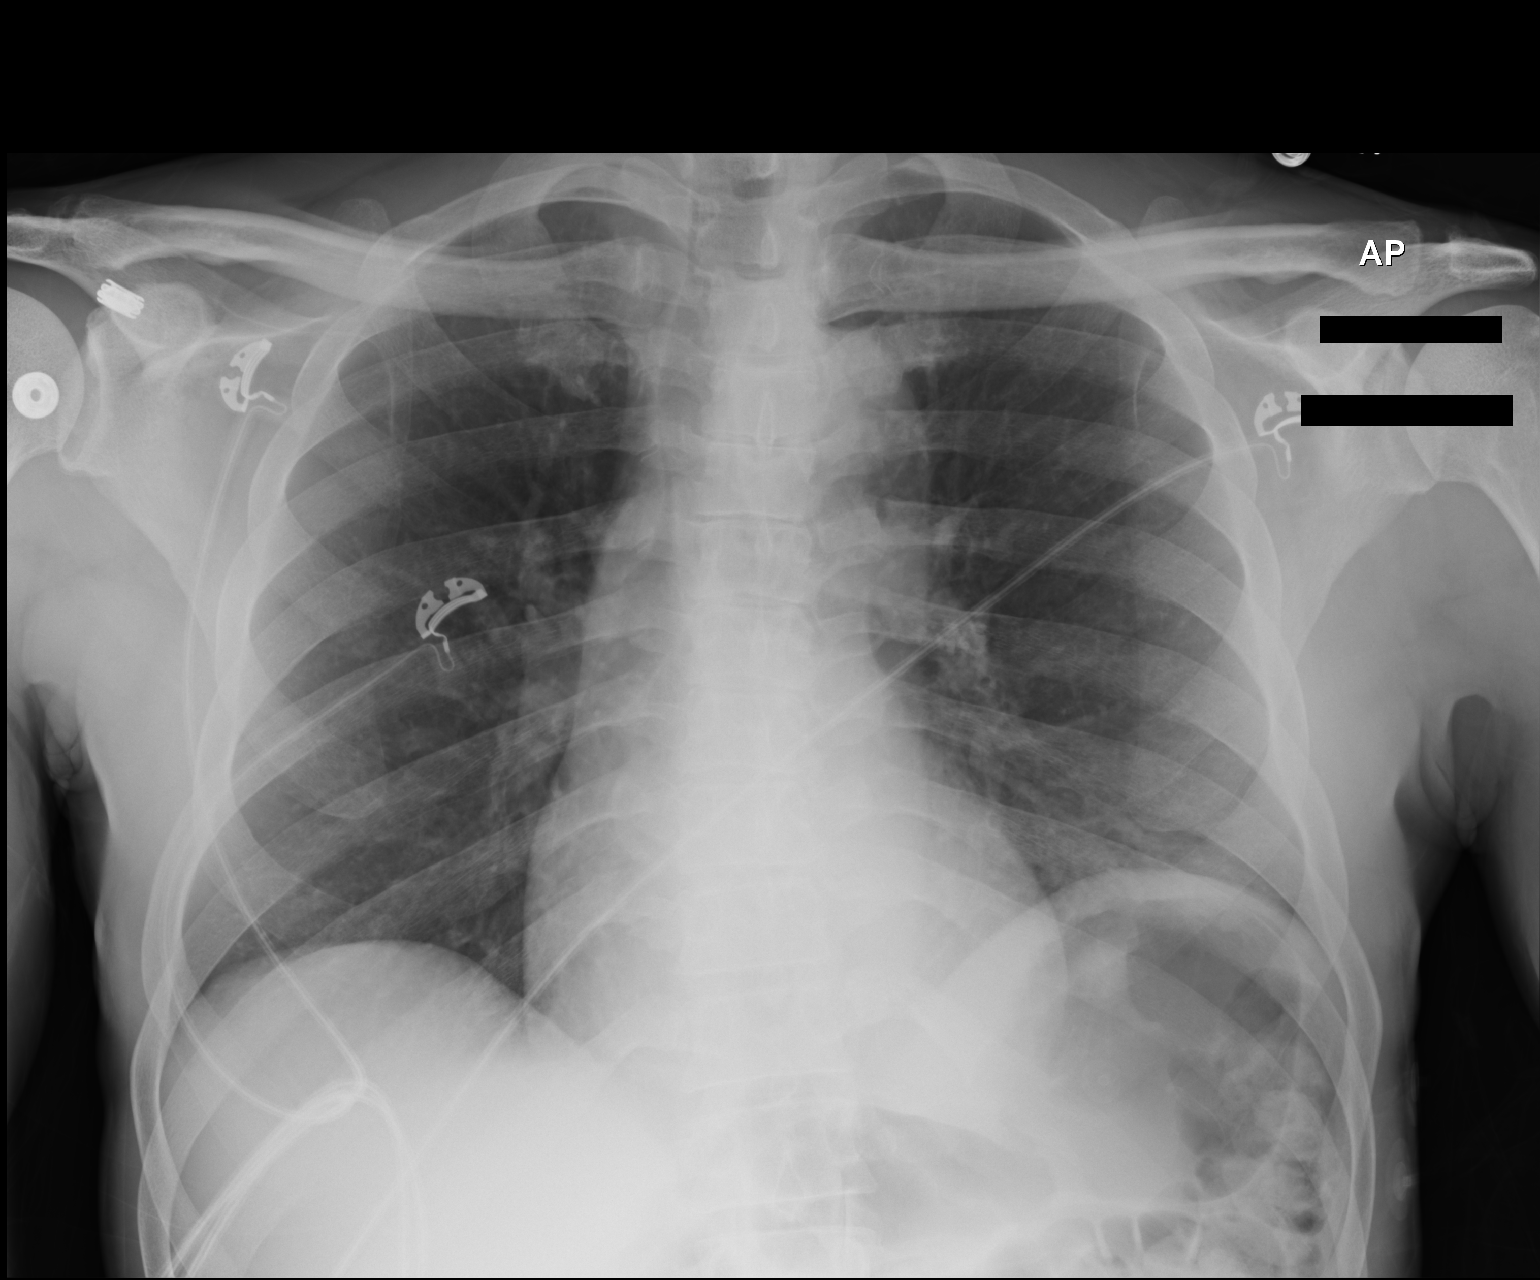

[1 of 1 positions shown; findings below may reference images not displayed]

FINDINGS: 1029 hrs. Lung volumes are low. No edema or focal airspace
consolidation. No pleural effusion. Cardiopericardial silhouette is
at upper limits of normal for size. Imaged bony structures of the
thorax are intact. Telemetry leads overlie the chest.
IMPRESSION: Low volume film without acute findings.
